# Patient Record
Sex: Male | Born: 1940 | Race: Black or African American | Hispanic: No | Marital: Single | Smoking: Former smoker
Health system: Southern US, Community
[De-identification: ages and names within clinical notes are randomized; demographics above are authoritative.]

## PROBLEM LIST (undated history)

## (undated) DIAGNOSIS — Z21 Asymptomatic human immunodeficiency virus [HIV] infection status: Secondary | ICD-10-CM

## (undated) DIAGNOSIS — E785 Hyperlipidemia, unspecified: Secondary | ICD-10-CM

## (undated) DIAGNOSIS — G629 Polyneuropathy, unspecified: Secondary | ICD-10-CM

## (undated) DIAGNOSIS — I10 Essential (primary) hypertension: Secondary | ICD-10-CM

## (undated) DIAGNOSIS — B20 Human immunodeficiency virus [HIV] disease: Secondary | ICD-10-CM

## (undated) DIAGNOSIS — I209 Angina pectoris, unspecified: Secondary | ICD-10-CM

## (undated) DIAGNOSIS — I2699 Other pulmonary embolism without acute cor pulmonale: Secondary | ICD-10-CM

## (undated) HISTORY — DX: Polyneuropathy, unspecified: G62.9

## (undated) HISTORY — DX: Asymptomatic human immunodeficiency virus (hiv) infection status: Z21

## (undated) HISTORY — DX: Human immunodeficiency virus (HIV) disease: B20

## (undated) HISTORY — DX: Essential (primary) hypertension: I10

## (undated) HISTORY — PX: LEG AMPUTATION BELOW KNEE: SHX694

## (undated) HISTORY — DX: Hyperlipidemia, unspecified: E78.5

## (undated) HISTORY — DX: Angina pectoris, unspecified: I20.9

## (undated) HISTORY — DX: Other pulmonary embolism without acute cor pulmonale: I26.99

---

## 2007-11-16 ENCOUNTER — Other Ambulatory Visit: Payer: Self-pay

## 2007-11-16 ENCOUNTER — Inpatient Hospital Stay: Payer: Self-pay | Admitting: Internal Medicine

## 2009-06-11 ENCOUNTER — Emergency Department: Payer: Self-pay | Admitting: Emergency Medicine

## 2010-06-05 ENCOUNTER — Emergency Department: Payer: Self-pay | Admitting: Unknown Physician Specialty

## 2012-11-08 ENCOUNTER — Encounter: Payer: Self-pay | Admitting: Internal Medicine

## 2013-03-10 ENCOUNTER — Emergency Department: Payer: Self-pay | Admitting: Emergency Medicine

## 2013-03-10 LAB — CBC
HCT: 44.4 % (ref 40.0–52.0)
HGB: 15.2 g/dL (ref 13.0–18.0)
MCH: 39.5 pg — ABNORMAL HIGH (ref 26.0–34.0)
MCHC: 34.3 g/dL (ref 32.0–36.0)
MCV: 115 fL — ABNORMAL HIGH (ref 80–100)
WBC: 3.5 10*3/uL — ABNORMAL LOW (ref 3.8–10.6)

## 2013-03-10 LAB — URINALYSIS, COMPLETE
Bacteria: NONE SEEN
Bilirubin,UR: NEGATIVE
Blood: NEGATIVE
Glucose,UR: NEGATIVE mg/dL (ref 0–75)
Ketone: NEGATIVE
Leukocyte Esterase: NEGATIVE
Ph: 6 (ref 4.5–8.0)
RBC,UR: 1 /HPF (ref 0–5)
Squamous Epithelial: 1
WBC UR: 1 /HPF (ref 0–5)

## 2013-03-10 LAB — SALICYLATE LEVEL: Salicylates, Serum: 1.8 mg/dL

## 2013-03-10 LAB — COMPREHENSIVE METABOLIC PANEL
Alkaline Phosphatase: 154 U/L — ABNORMAL HIGH (ref 50–136)
Anion Gap: 7 (ref 7–16)
Bilirubin,Total: 0.3 mg/dL (ref 0.2–1.0)
Calcium, Total: 8.3 mg/dL — ABNORMAL LOW (ref 8.5–10.1)
Co2: 23 mmol/L (ref 21–32)
Creatinine: 1.05 mg/dL (ref 0.60–1.30)
EGFR (African American): >60
EGFR (Non-African Amer.): >60
Potassium: 3.6 mmol/L (ref 3.5–5.1)
SGPT (ALT): 26 U/L (ref 12–78)

## 2013-03-10 LAB — DRUG SCREEN, URINE
Amphetamines, Ur Screen: NEGATIVE (ref ?–1000)
Benzodiazepine, Ur Scrn: POSITIVE (ref ?–200)
Cannabinoid 50 Ng, Ur ~~LOC~~: POSITIVE (ref ?–50)
Cocaine Metabolite,Ur ~~LOC~~: POSITIVE (ref ?–300)
MDMA (Ecstasy)Ur Screen: NEGATIVE (ref ?–500)
Methadone, Ur Screen: NEGATIVE (ref ?–300)

## 2013-03-10 LAB — TSH: Thyroid Stimulating Horm: 0.403 u[IU]/mL — ABNORMAL LOW

## 2013-03-10 LAB — TROPONIN I: Troponin-I: <0.02 ng/mL

## 2013-03-11 LAB — COMPREHENSIVE METABOLIC PANEL
Albumin: 2.7 g/dL — ABNORMAL LOW (ref 3.4–5.0)
Anion Gap: 5 — ABNORMAL LOW (ref 7–16)
Calcium, Total: 8 mg/dL — ABNORMAL LOW (ref 8.5–10.1)
Creatinine: 1.04 mg/dL (ref 0.60–1.30)
Glucose: 103 mg/dL — ABNORMAL HIGH (ref 65–99)
Osmolality: 277 (ref 275–301)
Potassium: 3.7 mmol/L (ref 3.5–5.1)
SGPT (ALT): 28 U/L (ref 12–78)
Total Protein: 6.8 g/dL (ref 6.4–8.2)

## 2013-03-11 LAB — CBC
HCT: 39.6 % — ABNORMAL LOW (ref 40.0–52.0)
HGB: 13.1 g/dL (ref 13.0–18.0)
MCH: 38.5 pg — ABNORMAL HIGH (ref 26.0–34.0)
MCV: 116 fL — ABNORMAL HIGH (ref 80–100)
Platelet: 159 10*3/uL (ref 150–440)
RDW: 14.7 % — ABNORMAL HIGH (ref 11.5–14.5)
WBC: 3.6 10*3/uL — ABNORMAL LOW (ref 3.8–10.6)

## 2013-11-11 ENCOUNTER — Emergency Department: Payer: Self-pay | Admitting: Emergency Medicine

## 2013-11-11 LAB — COMPREHENSIVE METABOLIC PANEL
Alkaline Phosphatase: 164 U/L — ABNORMAL HIGH (ref 50–136)
Anion Gap: 2 — ABNORMAL LOW (ref 7–16)
BUN: 17 mg/dL (ref 7–18)
Bilirubin,Total: 0.6 mg/dL (ref 0.2–1.0)
Calcium, Total: 9.2 mg/dL (ref 8.5–10.1)
Chloride: 111 mmol/L — ABNORMAL HIGH (ref 98–107)
Creatinine: 1.13 mg/dL (ref 0.60–1.30)
EGFR (African American): >60
Glucose: 86 mg/dL (ref 65–99)
Osmolality: 286 (ref 275–301)
SGPT (ALT): 114 U/L — ABNORMAL HIGH (ref 12–78)
Sodium: 143 mmol/L (ref 136–145)

## 2013-11-11 LAB — CBC
HCT: 42.8 % (ref 40.0–52.0)
HGB: 14.6 g/dL (ref 13.0–18.0)
MCHC: 34.1 g/dL (ref 32.0–36.0)
MCV: 112 fL — ABNORMAL HIGH (ref 80–100)
Platelet: 185 10*3/uL (ref 150–440)
WBC: 3.5 10*3/uL — ABNORMAL LOW (ref 3.8–10.6)

## 2014-01-06 ENCOUNTER — Emergency Department: Payer: Self-pay | Admitting: Emergency Medicine

## 2014-01-06 LAB — BASIC METABOLIC PANEL
Anion Gap: 4 — ABNORMAL LOW (ref 7–16)
BUN: 14 mg/dL (ref 7–18)
CALCIUM: 9.1 mg/dL (ref 8.5–10.1)
CHLORIDE: 110 mmol/L — AB (ref 98–107)
Co2: 30 mmol/L (ref 21–32)
Creatinine: 1.2 mg/dL (ref 0.60–1.30)
Glucose: 88 mg/dL (ref 65–99)
Osmolality: 287 (ref 275–301)
Potassium: 3.9 mmol/L (ref 3.5–5.1)
Sodium: 144 mmol/L (ref 136–145)

## 2014-01-06 LAB — CBC
HCT: 46.1 % (ref 40.0–52.0)
HGB: 15.6 g/dL (ref 13.0–18.0)
MCH: 36.6 pg — ABNORMAL HIGH (ref 26.0–34.0)
MCHC: 33.9 g/dL (ref 32.0–36.0)
MCV: 108 fL — ABNORMAL HIGH (ref 80–100)
Platelet: 216 10*3/uL (ref 150–440)
RBC: 4.27 10*6/uL — ABNORMAL LOW (ref 4.40–5.90)
RDW: 16 % — ABNORMAL HIGH (ref 11.5–14.5)
WBC: 5.1 10*3/uL (ref 3.8–10.6)

## 2014-01-06 LAB — PROTIME-INR
INR: 1
PROTHROMBIN TIME: 13.2 s (ref 11.5–14.7)

## 2014-02-09 ENCOUNTER — Emergency Department: Payer: Self-pay | Admitting: Emergency Medicine

## 2014-03-19 IMAGING — CR DG FOOT COMPLETE 3+V*L*
1 series · 3 of 3 positions shown · non-contrast
Comparison: None.

CLINICAL DATA: Nonhealing wound of left foot.

EXAM:
LEFT FOOT - COMPLETE 3+ VIEW

[Series 1: x foot ap left · 0.14mm/px · 3 of 3 slices shown]
[im 1/3]
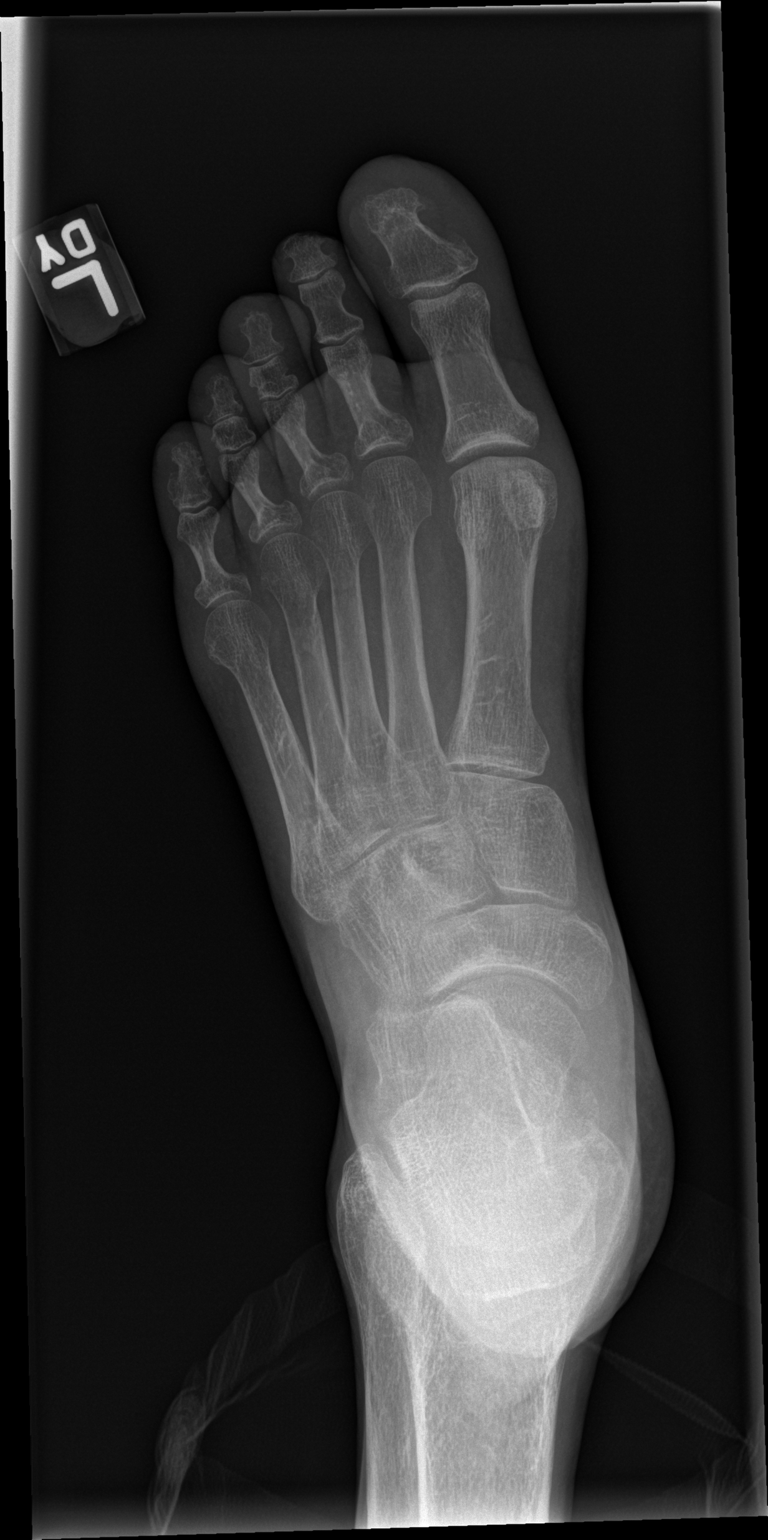
[im 2/3]
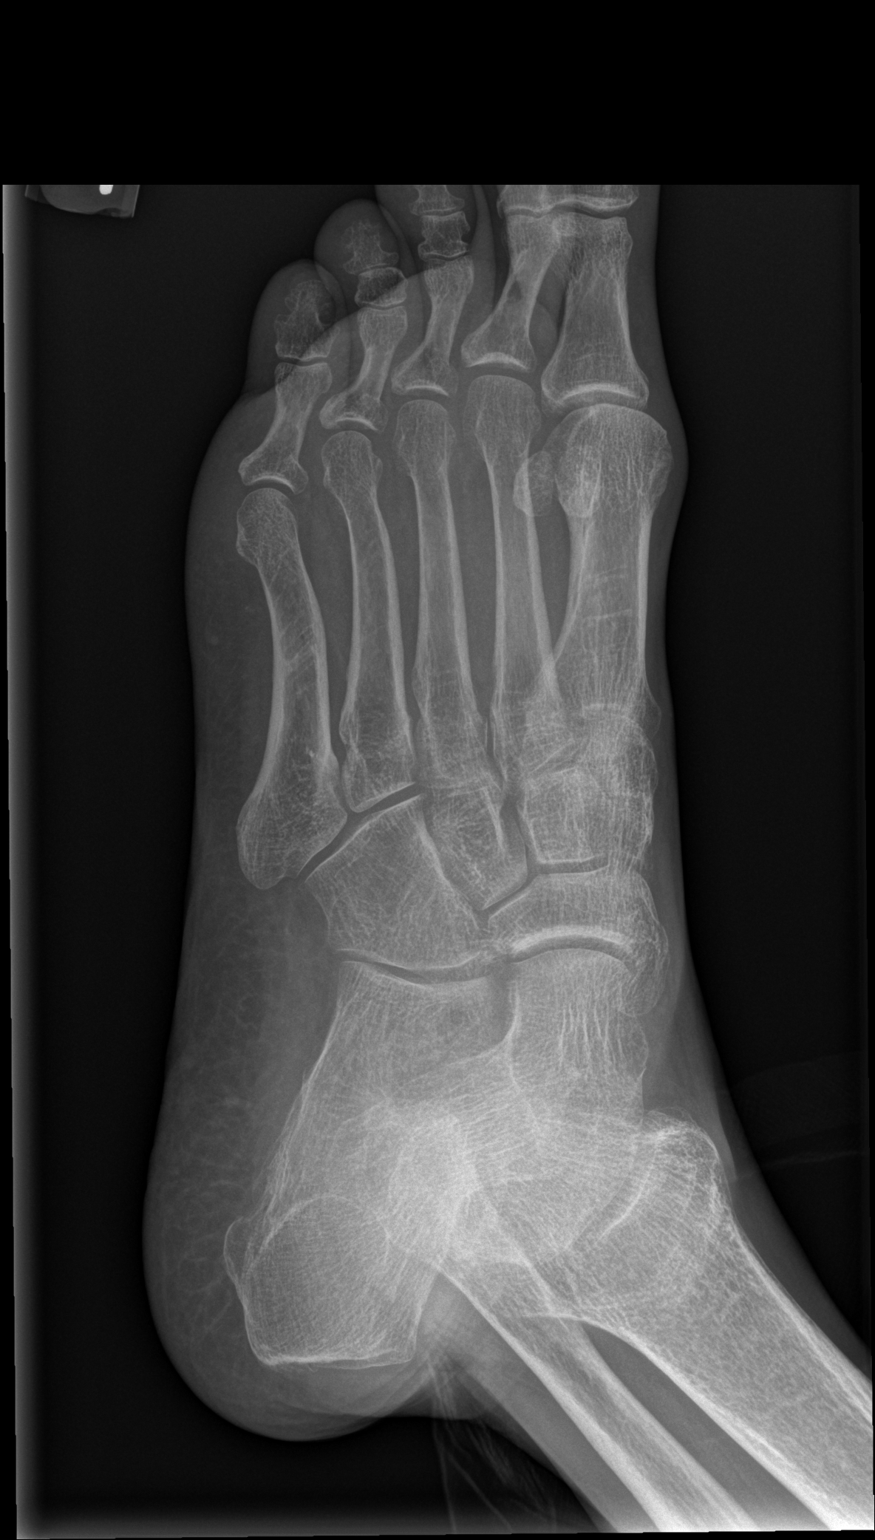
[im 3/3]
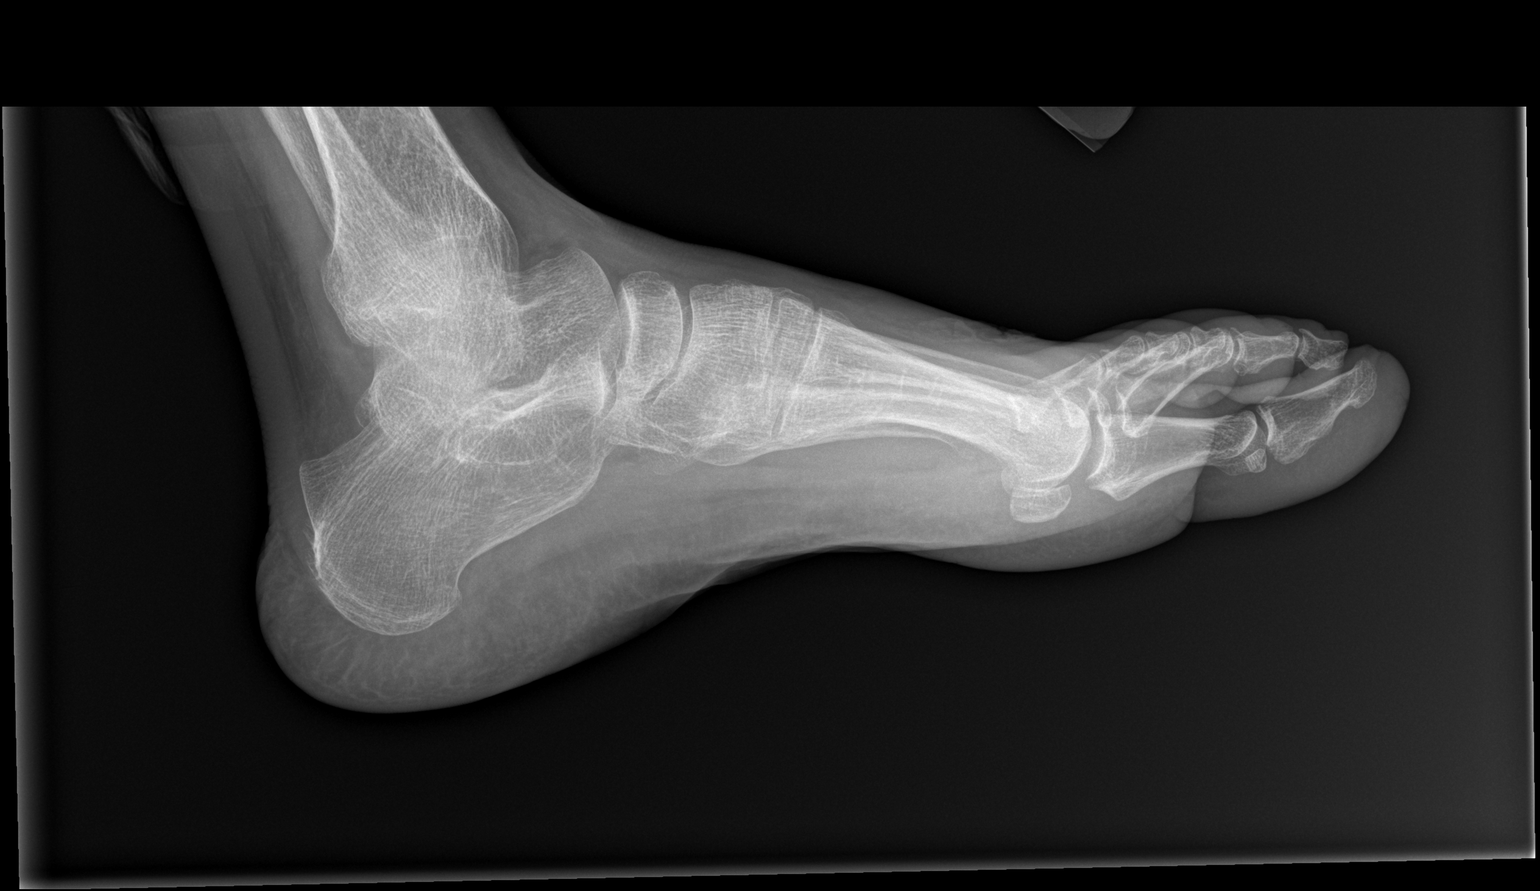

[3 of 3 positions shown; findings below may reference images not displayed]

FINDINGS: No fracture or dislocation is identified. There is no evidence of
bony destruction, soft tissue abnormality or soft tissue foreign
body. No significant arthropathy is identified. No bony lesions are
seen.
IMPRESSION: No evidence of osteomyelitis or other bony abnormalities.

## 2017-04-21 ENCOUNTER — Ambulatory Visit (INDEPENDENT_AMBULATORY_CARE_PROVIDER_SITE_OTHER): Payer: Medicare Other | Admitting: Vascular Surgery

## 2017-04-21 ENCOUNTER — Encounter (INDEPENDENT_AMBULATORY_CARE_PROVIDER_SITE_OTHER): Payer: Self-pay | Admitting: Vascular Surgery

## 2017-04-21 VITALS — BP 114/62 | HR 68 | Resp 16 | Ht 71.0 in | Wt 100.0 lb

## 2017-04-21 DIAGNOSIS — E785 Hyperlipidemia, unspecified: Secondary | ICD-10-CM | POA: Diagnosis not present

## 2017-04-21 DIAGNOSIS — I739 Peripheral vascular disease, unspecified: Secondary | ICD-10-CM | POA: Diagnosis not present
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
## 2017-10-08 ENCOUNTER — Emergency Department: Payer: 59

## 2017-10-08 ENCOUNTER — Emergency Department: Admission: EM | Admit: 2017-10-08 | Discharge: 2017-10-08 | Discharge status: Absent without leave | Payer: Self-pay

## 2017-10-08 DIAGNOSIS — Z7982 Long term (current) use of aspirin: Secondary | ICD-10-CM | POA: Diagnosis not present

## 2017-10-08 DIAGNOSIS — B20 Human immunodeficiency virus [HIV] disease: Secondary | ICD-10-CM | POA: Diagnosis not present

## 2017-10-08 DIAGNOSIS — Z79899 Other long term (current) drug therapy: Secondary | ICD-10-CM | POA: Insufficient documentation

## 2017-10-08 DIAGNOSIS — Z87891 Personal history of nicotine dependence: Secondary | ICD-10-CM | POA: Insufficient documentation

## 2017-10-08 DIAGNOSIS — N189 Chronic kidney disease, unspecified: Secondary | ICD-10-CM | POA: Diagnosis not present

## 2017-10-08 DIAGNOSIS — J9611 Chronic respiratory failure with hypoxia: Secondary | ICD-10-CM | POA: Insufficient documentation

## 2017-10-08 DIAGNOSIS — I509 Heart failure, unspecified: Secondary | ICD-10-CM | POA: Insufficient documentation

## 2017-10-08 DIAGNOSIS — I129 Hypertensive chronic kidney disease with stage 1 through stage 4 chronic kidney disease, or unspecified chronic kidney disease: Secondary | ICD-10-CM | POA: Insufficient documentation

## 2017-10-08 DIAGNOSIS — R0602 Shortness of breath: Secondary | ICD-10-CM | POA: Diagnosis present

## 2017-10-08 DIAGNOSIS — Z9981 Dependence on supplemental oxygen: Secondary | ICD-10-CM | POA: Diagnosis not present

## 2017-10-08 DIAGNOSIS — J449 Chronic obstructive pulmonary disease, unspecified: Secondary | ICD-10-CM | POA: Insufficient documentation

## 2017-10-08 LAB — BASIC METABOLIC PANEL
ANION GAP: 9 (ref 5–15)
BUN: 20 mg/dL (ref 6–20)
CALCIUM: 8.9 mg/dL (ref 8.9–10.3)
CO2: 30 mmol/L (ref 22–32)
Chloride: 105 mmol/L (ref 101–111)
Creatinine, Ser: 1.04 mg/dL (ref 0.61–1.24)
Glucose, Bld: 129 mg/dL — ABNORMAL HIGH (ref 65–99)
Potassium: 3.7 mmol/L (ref 3.5–5.1)
Sodium: 144 mmol/L (ref 135–145)

## 2017-10-08 LAB — CBC
HCT: 43.5 % (ref 40.0–52.0)
HEMOGLOBIN: 14.6 g/dL (ref 13.0–18.0)
MCH: 32.7 pg (ref 26.0–34.0)
MCHC: 33.7 g/dL (ref 32.0–36.0)
MCV: 97.1 fL (ref 80.0–100.0)
Platelets: 152 10*3/uL (ref 150–440)
RBC: 4.48 MIL/uL (ref 4.40–5.90)
RDW: 14.6 % — ABNORMAL HIGH (ref 11.5–14.5)
WBC: 3.5 10*3/uL — AB (ref 3.8–10.6)
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
## 2017-10-09 ENCOUNTER — Emergency Department
Admission: EM | Admit: 2017-10-09 | Discharge: 2017-10-10 | Disposition: A | Discharge status: Home / Self Care | Payer: 59 | Attending: Emergency Medicine | Admitting: Emergency Medicine

## 2017-10-09 DIAGNOSIS — J449 Chronic obstructive pulmonary disease, unspecified: Secondary | ICD-10-CM

## 2017-10-09 DIAGNOSIS — R0602 Shortness of breath: Secondary | ICD-10-CM

## 2017-10-09 DIAGNOSIS — J9611 Chronic respiratory failure with hypoxia: Secondary | ICD-10-CM | POA: Diagnosis not present

## 2017-10-09 MED ORDER — IPRATROPIUM-ALBUTEROL 0.5-2.5 (3) MG/3ML IN SOLN
3.0000 mL | RESPIRATORY_TRACT | Status: DC | PRN
  Administered 2017-10-09: 3 mL via RESPIRATORY_TRACT
  Filled 2017-10-09: qty 3

## 2017-10-09 MED ORDER — ACETAMINOPHEN 500 MG PO TABS
1000.0000 mg | ORAL_TABLET | Freq: Once | ORAL | Status: AC
  Administered 2017-10-09: 1000 mg via ORAL
  Filled 2017-10-09: qty 2
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
## 2017-10-09 NOTE — ED Notes (Signed)
Patient observed lying in bed with eyes closed  Even, unlabored respirations observed   NAD pt appears to be sleeping  I will continue to monitor along with every 15 minute visual observations and ongoing security monitoring    

## 2017-10-09 NOTE — ED Notes (Signed)
Pt's oxygen tank changed at this time, pt is on chronic 2L of oxygen

## 2017-10-09 NOTE — ED Notes (Addendum)
ED Is the patient under IVC or is there intent for IVC:  no Is the patient medically cleared: Yes.   Is there vacancy in the ED BHU: Yes.   Is the population mix appropriate for patient:  no Is the patient awaiting placement in inpatient or outpatient setting: Has the patient had a psychiatric consult:  No - not ordered  Survey of unit performed for contraband, proper placement and condition of furniture, tampering with fixtures in bathroom, shower, and each patient room: Yes.  ; Findings:  APPEARANCE/BEHAVIOR Calm and cooperative NEURO ASSESSMENT Orientation: oriented x3  Denies pain Hallucinations: No.None noted (Hallucinations) denies Speech: Normal Gait: normal RESPIRATORY ASSESSMENT Even  Unlabored respirations  CARDIOVASCULAR ASSESSMENT Pulses equal   regular rate  Skin warm and dry   GASTROINTESTINAL ASSESSMENT no GI complaint EXTREMITIES Full ROM  PLAN OF CARE Provide calm/safe environment. Vital signs assessed twice daily. ED BHU Assessment once each 12-hour shift. Collaborate with  TTS daily or as condition indicates. Assure the ED provider has rounded once each shift. Provide and encourage hygiene. Provide redirection as needed. Assess for escalating behavior; address immediately and inform ED provider.  Assess family dynamic and appropriateness for visitation as needed: Yes.  ; If necessary, describe findings:  Educate the patient/family about BHU procedures/visitation: Yes.  ; If necessary, describe findings:
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
## 2017-10-09 NOTE — ED Notes (Signed)
BEHAVIORAL HEALTH ROUNDING Patient sleeping: No. Patient alert and oriented: yes Behavior appropriate: Yes.  ; If no, describe:  Nutrition and fluids offered: yes Toileting and hygiene offered: Yes  Sitter present: q15 minute observations and security  monitoring Law enforcement present: Yes  ODS  

## 2017-10-09 NOTE — ED Notes (Signed)
Pt states he took his night time medications prior to this RN being assigned to pt.

## 2017-10-09 NOTE — ED Notes (Signed)

## 2017-10-10 MED ORDER — ACETAMINOPHEN 325 MG PO TABS
650.0000 mg | ORAL_TABLET | Freq: Four times a day (QID) | ORAL | Status: DC | PRN
  Administered 2017-10-10: 650 mg via ORAL

## 2017-10-10 MED ORDER — ACETAMINOPHEN 325 MG PO TABS
ORAL_TABLET | ORAL | Status: AC
  Filled 2017-10-10: qty 2

## 2017-10-10 NOTE — ED Notes (Signed)
Pt observed with no unusual behavior - lying in hallway bed - watching videos on his cell phone  Appropriate to stimulation  No verbalized needs or concerns at this time  NAD assessed  Continue to monitor

## 2017-10-10 NOTE — ED Notes (Signed)
BEHAVIORAL HEALTH ROUNDING Patient sleeping: No. Patient alert and oriented: yes Behavior appropriate: Yes.  ; If no, describe:  Nutrition and fluids offered: yes Toileting and hygiene offered: Yes  Sitter present: q15 minute observations and security  monitoring Law enforcement present: Yes  ODS  
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
## 2017-10-10 NOTE — ED Notes (Signed)
Pt awake no co's voiced. Eating a snack at this time. Encouraged to call for any needs.

## 2017-10-10 NOTE — ED Provider Notes (Signed)
O2 tank provided for transport. Family here to take home   Jene Every, MD 10/10/17 1354

## 2017-10-10 NOTE — ED Provider Notes (Signed)
-----------------------------------------   7:17 AM on 10/10/2017 -----------------------------------------  No events overnight. Clinical social work continues to work with patient.   Irean Hong, MD 10/10/17 [phone]

## 2017-10-10 NOTE — Care Management Note (Signed)
Case Management Note  Patient Details  Name:DILLION STOWERS Morad MRN: [id number] Date of Birth: Jun 28, 1941  Subjective/Objective:    Oxygen tank                Action/Plan:Spoke with [facility] /supply patients oxygen he will bring tank to ED [address].   Expected Discharge Date:                  Expected Discharge Plan:     In-House Referral:     Discharge planning Services     Post Acute Care Choice:    Choice offered to:     DME Arranged:    DME Agency:     HH Arranged:    HH Agency:     Status of Service:     If discussed at [employer], dates discussed:    Additional Comments:  Caren Macadam, RN 10/10/2017, 11:37 AM

## 2017-10-10 NOTE — Care Management Note (Signed)
Case Management Note  Patient Details  Name: Jason Small MRN: [id number] Date of Birth: 05/06/1941  Subjective/Objective:       oxygen             Action/Plan:   Expected Discharge Date:                  Expected Discharge Plan:     In-House Referral:     Discharge planning Services     Post Acute Care Choice:    Choice offered to:     DME Arranged:    DME Agency:     HH Arranged:   Oxygen tank delivered to patient in ED [facility]:   [facility]  Status of Service:     If discussed at Long Length of Stay Meetings, dates discussed:    Additional Comments:  Caren Macadam, RN 10/10/2017, 12:58 PM

## 2019-01-30 ENCOUNTER — Encounter: Payer: Self-pay | Admitting: Cardiology

## 2019-03-29 DEATH — deceased
